# Patient Record
Sex: Male | Born: 1985 | Race: Black or African American | Hispanic: No | Marital: Single | Smoking: Current every day smoker
Health system: Southern US, Community
[De-identification: ages and names within clinical notes are randomized; demographics above are authoritative.]

---
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
## 2013-05-19 ENCOUNTER — Encounter (HOSPITAL_COMMUNITY): Payer: Self-pay | Admitting: Emergency Medicine

## 2013-05-19 ENCOUNTER — Emergency Department (HOSPITAL_COMMUNITY)
Admission: EM | Admit: 2013-05-19 | Discharge: 2013-05-19 | Disposition: A | Discharge status: Home / Self Care | Payer: Self-pay | Attending: Emergency Medicine | Admitting: Emergency Medicine

## 2013-05-19 ENCOUNTER — Emergency Department (HOSPITAL_COMMUNITY): Payer: Self-pay

## 2013-05-19 DIAGNOSIS — Y9289 Other specified places as the place of occurrence of the external cause: Secondary | ICD-10-CM | POA: Insufficient documentation

## 2013-05-19 DIAGNOSIS — S6991XA Unspecified injury of right wrist, hand and finger(s), initial encounter: Secondary | ICD-10-CM

## 2013-05-19 DIAGNOSIS — W230XXA Caught, crushed, jammed, or pinched between moving objects, initial encounter: Secondary | ICD-10-CM | POA: Insufficient documentation

## 2013-05-19 DIAGNOSIS — F17200 Nicotine dependence, unspecified, uncomplicated: Secondary | ICD-10-CM | POA: Insufficient documentation

## 2013-05-19 DIAGNOSIS — Z8781 Personal history of (healed) traumatic fracture: Secondary | ICD-10-CM | POA: Insufficient documentation

## 2013-05-19 DIAGNOSIS — S6710XA Crushing injury of unspecified finger(s), initial encounter: Secondary | ICD-10-CM | POA: Insufficient documentation

## 2013-05-19 DIAGNOSIS — Y9389 Activity, other specified: Secondary | ICD-10-CM | POA: Insufficient documentation

## 2013-05-19 NOTE — ED Notes (Signed)
Right fifth finger "shut in car door" this am. Deformity noted.

## 2013-05-19 NOTE — ED Notes (Signed)
PA in. 

## 2013-05-19 NOTE — ED Provider Notes (Signed)
CSN: [id number]     Arrival date & time 05/19/13  1191 History  This chart was scribed for non-physician practitioner Arthor Captain, PA-C working with Shelda Jakes, MD by Valera Castle, ED scribe. This patient was seen in room TR08C/TR08C and the patient's care was started at 10:07 AM.    Chief Complaint  Patient presents with  . Finger Injury    The history is provided by the patient. No language interpreter was used.   HPI Comments: Brent Elliott is a 27 y.o. male who presents to the Emergency Department complaining of sudden, moderate, constant, right finger (5th digit) pain, with moderate swelling, onset earlier this morning when he slammed his finger in a car door. He reports that he had broken the same finger before 3-4 months ago while playing basketball, and states that it had been healing well. He reports he is able to move his finger, but has trouble bending it. 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 He denies numbness to his finger, and any other associated symptoms. He denies any medical history.   History reviewed. No pertinent past medical history. History reviewed. No pertinent past surgical history. No family history on file. History  Substance Use Topics  . Smoking status: Current Every Day Smoker  . Smokeless tobacco: Not on file  . Alcohol Use: No    Review of Systems  Musculoskeletal: Positive for arthralgias (Right finger pain (5th digit)).  Neurological: Negative for numbness.  All other systems reviewed and are negative.   Allergies  Review of patient's allergies indicates no known allergies.  Home Medications  No current outpatient prescriptions on file.  Triage Vitals: BP 120/71  Pulse 67  Temp(Src) 97.7 F (36.5 C) (Oral)  Resp 16  SpO2 100%  Physical Exam  Nursing note and vitals reviewed. Constitutional: He is oriented to person, place, and time. He appears well-developed and well-nourished. No distress.   HENT:  Head: Normocephalic and atraumatic.  Eyes: EOM are normal.  Neck: Neck supple. No tracheal deviation present.  Cardiovascular: Normal rate.   Good capillary refill.  Pulmonary/Chest: Effort normal. No respiratory distress.  Musculoskeletal: Normal range of motion.  PIP resting in flexion. Significant swelling over PIP with tenderness to palpation. Able to move joint minimally.   Neurological: He is alert and oriented to person, place, and time.  Skin: Skin is warm and dry.  Psychiatric: He has a normal mood and affect. His behavior is normal.    ED Course  Procedures (including critical care time)  DIAGNOSTIC STUDIES: Oxygen Saturation is 100% on room air, normal by my interpretation.    COORDINATION OF CARE: 10:10 AM-Discussed treatment plan which includes a right finger xray with pt at bedside and pt agreed to plan. Advised pt to apply ice to the area. Will give pt a splint for his finger.   Labs Review Labs Reviewed - No data to display Imaging Review Dg Finger Little Right  05/19/2013   CLINICAL DATA:  Right 5th digit caught in the car door. Pain proximally.  EXAM: RIGHT LITTLE FINGER 2+V  COMPARISON:  None.  FINDINGS: There is posttraumatic deformity of the distal visualized aspect of the right 5th metacarpal. There there is no acute fracture or dislocation. There is severe soft tissue swelling around the right 5th PIP joint. 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 There is a well corticated ossific fragment along the volar aspect of the right 5th PIP joint likely representing sequela  of prior trauma.  IMPRESSION: 1. No acute osseous injury of the right 5th digit.  2. There is severe soft tissue swelling surrounding the right 5th PIP joint.  3. 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   Electronically Signed   By: Elige Ko   On: 05/19/2013 09:58    EKG Interpretation   None      No orders of the  defined types were placed in this encounter.     MDM  No diagnosis found. Patient X-Ray negative for obvious fracture or dislocation. Pain managed in ED. Pt advised to follow up with Hand if symptoms persist for possibility of missed fracture diagnosis. Patient given brace while in ED, conservative therapy recommended and discussed. Patient will be dc home & is agreeable with above plan.     I personally performed the services described in this documentation, which was scribed in my presence. The recorded information has been reviewed and is accurate.     Arthor Captain, PA-C 05/19/13 1022

## 2013-05-21 NOTE — ED Provider Notes (Signed)
Medical screening examination/treatment/procedure(s) were performed by non-physician practitioner and as supervising physician I was immediately available for consultation/collaboration.  EKG Interpretation   None        Amai Cappiello W. Sharma Lawrance, MD 05/21/13 0911 

## 2013-06-30 ENCOUNTER — Emergency Department (HOSPITAL_COMMUNITY)
Admission: EM | Admit: 2013-06-30 | Discharge: 2013-06-30 | Disposition: A | Discharge status: Home / Self Care | Payer: Self-pay | Attending: Emergency Medicine | Admitting: Emergency Medicine

## 2013-06-30 ENCOUNTER — Emergency Department (HOSPITAL_COMMUNITY): Payer: Self-pay

## 2013-06-30 ENCOUNTER — Encounter (HOSPITAL_COMMUNITY): Payer: Self-pay | Admitting: Emergency Medicine

## 2013-06-30 DIAGNOSIS — G930 Cerebral cysts: Secondary | ICD-10-CM | POA: Insufficient documentation

## 2013-06-30 DIAGNOSIS — R42 Dizziness and giddiness: Secondary | ICD-10-CM | POA: Insufficient documentation

## 2013-06-30 DIAGNOSIS — R51 Headache: Secondary | ICD-10-CM | POA: Insufficient documentation

## 2013-06-30 DIAGNOSIS — R1110 Vomiting, unspecified: Secondary | ICD-10-CM | POA: Insufficient documentation

## 2013-06-30 DIAGNOSIS — F17200 Nicotine dependence, unspecified, uncomplicated: Secondary | ICD-10-CM | POA: Insufficient documentation

## 2013-06-30 DIAGNOSIS — R5381 Other malaise: Secondary | ICD-10-CM | POA: Insufficient documentation

## 2013-06-30 DIAGNOSIS — H9209 Otalgia, unspecified ear: Secondary | ICD-10-CM | POA: Insufficient documentation

## 2013-06-30 DIAGNOSIS — J029 Acute pharyngitis, unspecified: Secondary | ICD-10-CM | POA: Insufficient documentation

## 2013-06-30 LAB — COMPREHENSIVE METABOLIC PANEL
AST: 20 U/L (ref 0–37)
Alkaline Phosphatase: 50 U/L (ref 39–117)
BUN: 14 mg/dL (ref 6–23)
CO2: 26 mEq/L (ref 19–32)
Chloride: 100 mEq/L (ref 96–112)
Creatinine, Ser: 1.08 mg/dL (ref 0.50–1.35)
GFR calc Af Amer: >90 mL/min (ref >90)
GFR calc non Af Amer: >90 mL/min (ref >90)
Glucose, Bld: 92 mg/dL (ref 70–99)
Total Bilirubin: 0.7 mg/dL (ref 0.3–1.2)

## 2013-06-30 LAB — CBC WITH DIFFERENTIAL/PLATELET
Eosinophils Relative: 3 % (ref 0–5)
Hemoglobin: 15.8 g/dL (ref 13.0–17.0)
Lymphocytes Relative: 27 % (ref 12–46)
MCH: 31.6 pg (ref 26.0–34.0)
MCV: 89.4 fL (ref 78.0–100.0)
Monocytes Absolute: 0.5 10*3/uL (ref 0.1–1.0)
Monocytes Relative: 9 % (ref 3–12)
Neutro Abs: 3.3 10*3/uL (ref 1.7–7.7)
Platelets: 129 10*3/uL — ABNORMAL LOW (ref 150–400)
RBC: 5 MIL/uL (ref 4.22–5.81)
RDW: 13.1 % (ref 11.5–15.5)
WBC: 5.4 10*3/uL (ref 4.0–10.5)

## 2013-06-30 MED ORDER — DIPHENHYDRAMINE HCL 50 MG/ML IJ SOLN
25.0000 mg | Freq: Once | INTRAMUSCULAR | Status: AC
  Administered 2013-06-30: 25 mg via INTRAVENOUS
  Filled 2013-06-30: qty 1

## 2013-06-30 MED ORDER — IBUPROFEN 800 MG PO TABS: 800.0000 mg | ORAL_TABLET | Freq: Four times a day (QID) | ORAL | Status: DC | PRN

## 2013-06-30 MED ORDER — ONDANSETRON 4 MG PO TBDP: 4.0000 mg | ORAL_TABLET | Freq: Three times a day (TID) | ORAL | Status: DC | PRN

## 2013-06-30 MED ORDER — SODIUM CHLORIDE 0.9 % IV BOLUS (SEPSIS)
1000.0000 mL | Freq: Once | INTRAVENOUS | Status: AC
  Administered 2013-06-30: 1000 mL via INTRAVENOUS

## 2013-06-30 MED ORDER — METOCLOPRAMIDE HCL 5 MG/ML IJ SOLN
10.0000 mg | Freq: Once | INTRAMUSCULAR | Status: AC
  Administered 2013-06-30: 10 mg via INTRAVENOUS
  Filled 2013-06-30: qty 2

## 2013-06-30 NOTE — ED Notes (Addendum)
Per EMS: pt c/o vomiting in the am x 2 months, fever x 1 month, sore throat x 2 days, and right sided head pain x 3 months. Denies diarrhea, SOB, dizziness.

## 2013-06-30 NOTE — ED Notes (Signed)
MD at bedside. 

## 2013-06-30 NOTE — ED Notes (Signed)
Patient transported to CT 
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
## 2013-06-30 NOTE — Progress Notes (Signed)
[facility] provided pt with a list of primary care resources, a [facility] orange Card application, and information on [facility].

## 2013-07-02 NOTE — ED Provider Notes (Signed)
Medical screening examination/treatment/procedure(s) were performed by non-physician practitioner and as supervising physician I was immediately available for consultation/collaboration.  EKG Interpretation   None         Junius Argyle, MD 07/02/13 1156

## 2014-01-27 ENCOUNTER — Emergency Department (HOSPITAL_COMMUNITY)
Admission: EM | Admit: 2014-01-27 | Discharge: 2014-01-27 | Disposition: A | Discharge status: Home / Self Care | Payer: Self-pay | Attending: Emergency Medicine | Admitting: Emergency Medicine

## 2014-01-27 ENCOUNTER — Encounter (HOSPITAL_COMMUNITY): Payer: Self-pay | Admitting: Emergency Medicine

## 2014-01-27 DIAGNOSIS — F17200 Nicotine dependence, unspecified, uncomplicated: Secondary | ICD-10-CM | POA: Insufficient documentation

## 2014-01-27 DIAGNOSIS — R51 Headache: Secondary | ICD-10-CM | POA: Insufficient documentation

## 2014-01-27 DIAGNOSIS — R6883 Chills (without fever): Secondary | ICD-10-CM | POA: Insufficient documentation

## 2014-01-27 DIAGNOSIS — R519 Headache, unspecified: Secondary | ICD-10-CM

## 2014-01-27 MED ORDER — METOCLOPRAMIDE HCL 10 MG PO TABS
10.0000 mg | ORAL_TABLET | Freq: Once | ORAL | Status: AC
  Administered 2014-01-27: 10 mg via ORAL
  Filled 2014-01-27: qty 1

## 2014-01-27 MED ORDER — METOCLOPRAMIDE HCL 5 MG/ML IJ SOLN
10.0000 mg | Freq: Once | INTRAMUSCULAR | Status: DC
  Filled 2014-01-27: qty 2

## 2014-01-27 MED ORDER — SODIUM CHLORIDE 0.9 % IV BOLUS (SEPSIS): 1000.0000 mL | Freq: Once | INTRAVENOUS | Status: DC

## 2014-01-27 MED ORDER — DIPHENHYDRAMINE HCL 50 MG/ML IJ SOLN
25.0000 mg | Freq: Once | INTRAMUSCULAR | Status: DC
Start: 2014-01-27 — End: 2014-01-27
  Filled 2014-01-27: qty 1

## 2014-01-27 MED ORDER — DEXAMETHASONE SODIUM PHOSPHATE 10 MG/ML IJ SOLN
10.0000 mg | Freq: Once | INTRAMUSCULAR | Status: DC
  Filled 2014-01-27: qty 1

## 2014-01-27 NOTE — ED Provider Notes (Signed)
CSN: [id number]     Arrival date & time 01/27/14  [id number] History   First MD Initiated Contact with Patient 01/27/14 [id number]     Chief Complaint  Patient presents with  . Headache     (Consider location/radiation/quality/duration/timing/severity/associated sxs/prior Treatment) Patient is a 28 y.o. male presenting with headaches. The history is provided by the patient. No language interpreter was used.  Headache Pain location:  L temporal and R temporal Quality:  Dull Radiates to:  Does not radiate Pain severity now: moderate. Pain scale at highest: severe. Onset quality:  Unable to specify Duration:  3 days Timing:  Intermittent Progression:  Waxing and waning Chronicity:  Recurrent Similar to prior headaches: yes   Relieved by:  Nothing Worsened by:  Nothing tried Ineffective treatments:  None tried Associated symptoms: no abdominal pain, no back pain, no congestion, no cough, no diarrhea, no dizziness, no ear pain, no pain, no fatigue, no fever, no focal weakness, no hearing loss, no loss of balance, no nausea, no near-syncope, no neck stiffness, no numbness, no photophobia, no seizures, no syncope, no visual change and no vomiting   Associated symptoms comment:  Chills  Risk factors comment:  Hx of incidental arachnoid cyst   History reviewed. No pertinent past medical history. History reviewed. No pertinent past surgical history. No family history on file. History  Substance Use Topics  . Smoking status: Current Some Day Smoker  . Smokeless tobacco: Not on file  . Alcohol Use: Yes     Comment: occ    Review of Systems  Constitutional: Positive for chills. Negative for fever, activity change, appetite change and fatigue.  HENT: Negative for congestion, ear pain, facial swelling, hearing loss, rhinorrhea and trouble swallowing.   Eyes: Negative for photophobia and pain.  Respiratory: Negative for cough, chest tightness and shortness of breath.   Cardiovascular: Negative for  chest pain, leg swelling, syncope and near-syncope.  Gastrointestinal: Negative for nausea, vomiting, abdominal pain, diarrhea and constipation.  Endocrine: Negative for polydipsia and polyuria.  Genitourinary: Negative for dysuria, urgency, decreased urine volume and difficulty urinating.  Musculoskeletal: Negative for back pain, gait problem and neck stiffness.  Skin: Negative for color change, rash and wound.  Allergic/Immunologic: Negative for immunocompromised state.  Neurological: Positive for headaches. Negative for dizziness, focal weakness, seizures, facial asymmetry, speech difficulty, weakness, numbness and loss of balance.  Psychiatric/Behavioral: Negative for confusion, decreased concentration and agitation.      Allergies  Review of patient's allergies indicates no known allergies.  Home Medications   Prior to Admission medications   Medication Sig Start Date End Date Taking? Authorizing Provider  ibuprofen (ADVIL,MOTRIN) 200 MG tablet Take 400 mg by mouth every 6 (six) hours as needed for moderate pain.   Yes Historical Provider, MD   BP 113/68  Pulse 70  Temp(Src) 99.7 F (37.6 C) (Oral)  Resp 18  Ht 6\' 1"  (1.854 m)  Wt 185 lb (83.915 kg)  BMI 24.41 kg/m2  SpO2 97% Physical Exam  Constitutional: He is oriented to person, place, and time. He appears well-developed and well-nourished. No distress.  HENT:  Head: Normocephalic and atraumatic.  Mouth/Throat: No oropharyngeal exudate.  Eyes: Pupils are equal, round, and reactive to light.  Neck: Normal range of motion. Neck supple.  Cardiovascular: Normal rate, regular rhythm and normal heart sounds.  Exam reveals no gallop and no friction rub.   No murmur heard. Pulmonary/Chest: Effort normal and breath sounds normal. No respiratory distress. He has no wheezes. He has  no rales.  Abdominal: Soft. Bowel sounds are normal. He exhibits no distension and no mass. There is no tenderness. There is no rebound and no  guarding.  Musculoskeletal: Normal range of motion. He exhibits no edema and no tenderness.  Neurological: He is alert and oriented to person, place, and time. He has normal strength. He displays no tremor. No cranial nerve deficit or sensory deficit. He exhibits normal muscle tone. He displays a negative Romberg sign. Coordination and gait normal. GCS eye subscore is 4. GCS verbal subscore is 5. GCS motor subscore is 6.  Skin: Skin is warm and dry.  Psychiatric: He has a normal mood and affect.    ED Course  Procedures (including critical care time) Labs Review Labs Reviewed - No data to display  Imaging Review No results found.   EKG Interpretation None      MDM   Final diagnoses:  Nonintractable episodic headache, unspecified headache type    Pt is a 28 y.o. male with Pmhx as above who presents with recurrent h/a's. Plan was to treat w/ a migraine cocktail and reassess, but pt tells nursing after I leave the room that he has to leave.  He has an appointment with a "brain doctor" this morning at 7:45 (about 3 hrs from now). I believe he has the capacity to make this decision and understands I cannot rule out a potentially life threatening cause of his h/a. He agrees to keep appt todday and to come back should symptoms worsen including worsening h/a, numbness, weakness, neck stiffness        Shanna Cisco, MD 01/27/14 0425

## 2014-01-27 NOTE — ED Notes (Signed)
Pt states that he has had a headache for 2-3 days; pt states that he had a similar headache 4-5 months ago and was diagnosed with a "cyst" inside his head; pt states that he was advised to follow up with someone but doesn't remember who and pt states that he did not follow up; pt denies N/V or blurred vision.

## 2014-01-27 NOTE — Discharge Instructions (Signed)

## 2015-01-11 ENCOUNTER — Encounter (HOSPITAL_COMMUNITY): Payer: Self-pay | Admitting: Emergency Medicine

## 2015-01-11 ENCOUNTER — Emergency Department (HOSPITAL_COMMUNITY)
Admission: EM | Admit: 2015-01-11 | Discharge: 2015-01-11 | Disposition: A | Discharge status: Home / Self Care | Payer: Self-pay | Attending: Emergency Medicine | Admitting: Emergency Medicine

## 2015-01-11 DIAGNOSIS — K029 Dental caries, unspecified: Secondary | ICD-10-CM | POA: Insufficient documentation

## 2015-01-11 DIAGNOSIS — K0381 Cracked tooth: Secondary | ICD-10-CM | POA: Insufficient documentation

## 2015-01-11 DIAGNOSIS — Z720 Tobacco use: Secondary | ICD-10-CM | POA: Insufficient documentation

## 2015-01-11 MED ORDER — PENICILLIN V POTASSIUM 500 MG PO TABS: 500.0000 mg | ORAL_TABLET | Freq: Four times a day (QID) | ORAL | Status: AC

## 2015-01-11 MED ORDER — TRAMADOL HCL 50 MG PO TABS: 50.0000 mg | ORAL_TABLET | Freq: Four times a day (QID) | ORAL | Status: DC | PRN

## 2015-01-11 MED ORDER — IBUPROFEN 600 MG PO TABS: 600.0000 mg | ORAL_TABLET | Freq: Four times a day (QID) | ORAL | Status: DC | PRN

## 2015-01-11 NOTE — ED Provider Notes (Signed)
CSN: [id number]     Arrival date & time 01/11/15  1814 History  This chart was scribed for Junius Finner, PA-C, working with Pricilla Loveless, MD by Chestine Spore, ED Scribe. The patient was seen in room WTR6/WTR6 at 6:25 PM.    Chief Complaint  Patient presents with  . Dental Pain      The history is provided by the patient. No language interpreter was used.    HPI Comments: Brent Elliott is a 29 y.o. male who presents to the Emergency Department complaining of intermittent dental pain onset 1 year. He reports that his pain is 9/10 and chewing worsens the pain. He states that he is having associated symptoms of ear pain. He states that he has tried tylenol and orajel with no relief for his symptoms. He denies fever, n/v, drainage, and any other symptoms. Pt denies having a dentist. He notes that he does smoke.   History reviewed. No pertinent past medical history. History reviewed. No pertinent past surgical history. No family history on file. History  Substance Use Topics  . Smoking status: Current Some Day Smoker  . Smokeless tobacco: Not on file  . Alcohol Use: Yes     Comment: occ    Review of Systems  Constitutional: Negative for fever.  HENT: Positive for dental problem and ear pain.   Gastrointestinal: Negative for nausea and vomiting.      Allergies  Review of patient's allergies indicates no known allergies.  Home Medications   Prior to Admission medications   Medication Sig Start Date End Date Taking? Authorizing Provider  benzocaine (ORAJEL) 10 % mucosal gel Use as directed 1 application in the mouth or throat as needed for mouth pain.   Yes Historical Provider, MD  ibuprofen (ADVIL,MOTRIN) 600 MG tablet Take 1 tablet (600 mg total) by mouth every 6 (six) hours as needed. 01/11/15   Junius Finner, PA-C  penicillin v potassium (VEETID) 500 MG tablet Take 1 tablet (500 mg total) by mouth 4 (four) times daily. 01/11/15 01/18/15  Junius Finner, PA-C  traMADol (ULTRAM)  50 MG tablet Take 1 tablet (50 mg total) by mouth every 6 (six) hours as needed. 01/11/15   Junius Finner, PA-C   BP 108/63 mmHg  Pulse 60  Temp(Src) 98.5 F (36.9 C) (Oral)  Resp 16  SpO2 99% Physical Exam  Constitutional: He is oriented to person, place, and time. He appears well-developed and well-nourished. No distress.  HENT:  Head: Normocephalic and atraumatic.  Mouth/Throat: Abnormal dentition.  All four wisdom teeth cracked, with dental decay. Left upper wisdom tooth is decayed down to the gum line  Eyes: EOM are normal.  Neck: Neck supple. No tracheal deviation present.  Cardiovascular: Normal rate.   Pulmonary/Chest: Effort normal. No respiratory distress.  Musculoskeletal: Normal range of motion.  Neurological: He is alert and oriented to person, place, and time.  Skin: Skin is warm and dry.  Psychiatric: He has a normal mood and affect. His behavior is normal.  Nursing note and vitals reviewed.   ED Course  Procedures (including critical care time) COORDINATION OF CARE: 6:27 PM-Discussed treatment plan which includes abx, pain medications, referral and f/u with dentist with pt at bedside and pt agreed to plan.   Labs Review Labs Reviewed - No data to display  Imaging Review No results found.   EKG Interpretation None      MDM   Final diagnoses:  Dental decay  Pain due to dental caries  Pt is a 29yo male c/o dental pain. Pt is a daily smoker. Dental decay on exam w/o gingival abscesses.  No respiratory distress.   Rx: PCN and tramadol. Dental resources including contact info for Dr. Russella Dar, DDS, as well as community resource guide for PCP provided. Return precautions provided. Pt verbalized understanding and agreement with tx plan.   I personally performed the services described in this documentation, which was scribed in my presence. The recorded information has been reviewed and is accurate.    Junius Finner, PA-C 01/11/15 1842  Pricilla Loveless, MD 01/13/15 1046

## 2015-01-11 NOTE — Discharge Instructions (Signed)
Dental Care and Dentist Visits °Dental care supports good overall health. Regular dental visits can also help you avoid dental pain, bleeding, infection, and other more serious health problems in the future. It is important to keep the mouth healthy because diseases in the teeth, gums, and other oral tissues can spread to other areas of the body. Some problems, such as diabetes, heart disease, and pre-term labor have been associated with poor oral health.  °See your dentist every 6 months. If you experience emergency problems such as a toothache or broken tooth, go to the dentist right away. If you see your dentist regularly, you may catch problems early. It is easier to be treated for problems in the early stages.  °WHAT TO EXPECT AT A DENTIST VISIT  °Your dentist will look for many common oral health problems and recommend proper treatment. At your regular dental visit, you can expect: °· Gentle cleaning of the teeth and gums. This includes scraping and polishing. This helps to remove the sticky substance around the teeth and gums (plaque). Plaque forms in the mouth shortly after eating. Over time, plaque hardens on the teeth as tartar. If tartar is not removed regularly, it can cause problems. Cleaning also helps remove stains. °· Periodic X-rays. These pictures of the teeth and supporting bone will help your dentist assess the health of your teeth. °· Periodic fluoride treatments. Fluoride is a natural mineral shown to help strengthen teeth. Fluoride treatment involves applying a fluoride gel or varnish to the teeth. It is most commonly done in children. °· Examination of the mouth, tongue, jaws, teeth, and gums to look for any oral health problems, such as: °· Cavities (dental caries). This is decay on the tooth caused by plaque, sugar, and acid in the mouth. It is best to catch a cavity when it is small. °· Inflammation of the gums caused by plaque buildup (gingivitis). °· Problems with the mouth or malformed  or misaligned teeth. °· Oral cancer or other diseases of the soft tissues or jaws.  °KEEP YOUR TEETH AND GUMS HEALTHY °For healthy teeth and gums, follow these general guidelines as well as your dentist's specific advice: °· Have your teeth professionally cleaned at the dentist every 6 months. °· Brush twice daily with a fluoride toothpaste. °· Floss your teeth daily.  °· Ask your dentist if you need fluoride supplements, treatments, or fluoride toothpaste. °· Eat a healthy diet. Reduce foods and drinks with added sugar. °· Avoid smoking. °TREATMENT FOR ORAL HEALTH PROBLEMS °If you have oral health problems, treatment varies depending on the conditions present in your teeth and gums. °· Your caregiver will most likely recommend good oral hygiene at each visit. °· For cavities, gingivitis, or other oral health disease, your caregiver will perform a procedure to treat the problem. This is typically done at a separate appointment. Sometimes your caregiver will refer you to another dental specialist for specific tooth problems or for surgery. °SEEK IMMEDIATE DENTAL CARE IF: °· You have pain, bleeding, or soreness in the gum, tooth, jaw, or mouth area. °· A permanent tooth becomes loose or separated from the gum socket. °· You experience a blow or injury to the mouth or jaw area. °Document Released: 03/20/2011 Document Revised: 09/30/2011 Document Reviewed: 03/20/2011 °ExitCare® Patient Information ©2015 [employer]. This information is not intended to replace advice given to you by your health care provider. Make sure you discuss any questions you have with your health care provider. ° °Dental Pain °Toothache is pain in or around   a tooth. It may get worse with chewing or with cold or heat.  HOME CARE  Your dentist may use a numbing medicine during treatment. If so, you may need to avoid eating until the medicine wears off. Ask your dentist about this.  Only take medicine as told by your dentist or  doctor.  Avoid chewing food near the painful tooth until after all treatment is done. Ask your dentist about this. GET HELP RIGHT AWAY IF:   The problem gets worse or new problems appear.  You have a fever.  There is redness and puffiness (swelling) of the face, jaw, or neck.  You cannot open your mouth.  There is pain in the jaw.  There is very bad pain that is not helped by medicine. MAKE SURE YOU:   Understand these instructions.  Will watch your condition.  Will get help right away if you are not doing well or get worse. Document Released: 12/25/2007 Document Revised: 09/30/2011 Document Reviewed: 12/25/2007 [facility] Patient Information 2015 [place], [place]. This information is not intended to replace advice given to you by your health care provider. Make sure you discuss any questions you have with your health care provider.   Emergency Department Resource Guide 1) Find a Doctor and Pay Out of Pocket Although you won't have to find out who is covered by your insurance plan, it is a good idea to ask around and get recommendations. You will then need to call the office and see if the doctor you have chosen will accept you as a new patient and what types of options they offer for patients who are self-pay. Some doctors offer discounts or will set up payment plans for their patients who do not have insurance, but you will need to ask so you aren't surprised when you get to your appointment.  2) Contact Your Local Health Department Not all health departments have doctors that can see patients for sick visits, but many do, so it is worth a call to see if yours does. If you don't know where your local health department is, you can check in your phone book. The CDC also has a tool to help you locate your state's health department, and many state websites also have listings of all of their local health departments.  3) Find a Walk-in Clinic If your illness is not likely to be very  severe or complicated, you may want to try a walk in clinic. These are popping up all over the country in pharmacies, drugstores, and shopping centers. They're usually staffed by nurse practitioners or physician assistants that have been trained to treat common illnesses and complaints. They're usually fairly quick and inexpensive. However, if you have serious medical issues or chronic medical problems, these are probably not your best option.  No Primary Care Doctor: - Call Health Connect at  [phone] - they can help you locate a primary care doctor that  accepts your insurance, provides certain services, etc. - Physician Referral Service- [phone]  Chronic Pain Problems: Organization         Address  Phone   Notes  [facility]  [phone] Patients need to be referred by their primary care doctor.   Medication Assistance: Organization         Address  Phone   Notes  [facility] Medication [facility] [address] [place], [place] [address] [phone] --Must be a resident of [facility] -- Must have NO insurance  coverage whatsoever (no Medicaid/ Medicare, etc.) -- The pt. MUST have a primary care doctor that directs their care regularly and follows them in the community   [facility]ist  [phone]   [employer]  [phone]    Agencies that provide inexpensive medical care: Organization         Address  Phone   Notes  [facility]  [phone]   [facility]    [phone]   [facility] [address] [place], [place] [address] [phone]   [facility] [address] [place], [place] [phone]   Planned Parenthood    [phone]   [facility]    [phone]   [facility]  [address], [place] Phone:  [phone], Fax:  [phone] Hours of Operation:  9 am - 6 pm, M-F.  Also accepts  Medicaid/Medicare and self-pay.  [facility]  [address], [place] Phone: [phone], Fax: [phone]. Hours of Operation:  8:30 am - 5:30 pm, M-F.  Also accepts Medicaid and self-pay.  [facility] [place] [address] [place] Phone: [phone]   [facility] [address] Natasha Bence [place], [place] [phone], Ext. [phone] Mondays & Thursdays: 7-9 AM.  First 15 patients are seen on a first come, first serve basis.    Medicaid-accepting [facility] Providers:  Organization         Address  Phone   Notes  [facility] [address], [place] [phone] Also accepts self-pay patients.  [facility] [address] Laurell Josephs [place], [place]  [phone]   [facility] [address], [place] [phone]   [facility] [address], [place] [phone]   Renaye Rakers [address], [place]   [phone] Only accepts [place] patients after they have their name applied to their card.   Self-Pay (no insurance) in [facility]:  Organization         Address  Phone   Notes  Sickle Cell Patients, [facility] [address] [place], [place] [phone]   [facility] [address] [place], [place] [phone]   [facility]  [address], [place] [phone]   [facility]/Dr. Osei-Bonsu  [address] [place] or [address], [place] [phone] Phone number for both [place] and [place] locations is the same.  [facility] [address], [place] [phone]   [facility] [address], [place] or [address] [phone]   [facility] [address], [place] [phone], phone; [phone], fax Sees patients 1st and 3rd Saturday of every month.  Must not qualify for public or private insurance (i.e. Medicaid, Medicare, [place] Health Choice, Veterans' Benefits)  Household income should be no more than 200% of the poverty level The clinic cannot treat you if you are pregnant or think you are pregnant  Sexually transmitted diseases are not treated at the clinic.    Dental Care: Organization         Address  Phone  Notes  [facility] Department  of Public Health [facility] [address] [place], [place] [phone] Accepts children up to age 70 who are enrolled in [place] or [place] Health Choice; pregnant women with a Medicaid card; and children who have applied for Medicaid or [place] Health Choice, but were declined, whose parents can pay a reduced fee at time of service.  [facility] Department of [facility]  [address], [place] [phone] Accepts children up to age 32 who are enrolled in [place] or [place] Health Choice; pregnant women with a Medicaid card; and children who have applied for Medicaid or [place] Health Choice, but were declined, whose parents can pay a reduced fee at time of service.  [facility]  [address] [place], [place] [phone] Patients are seen by appointment only. Walk-ins are not accepted. [facility] will see patients 88 years of age and older. Monday - Tuesday (8am-5pm) Most Wednesdays (8:30-5pm) $30 per visit, cash only  [facility] Adult Dental Access PROGRAM  [address], [facility] [phone] Patients are seen by appointment only. Walk-ins are not accepted. [facility] will see patients 50 years of age and older. One Wednesday Evening (Monthly: Volunteer Based).  $30 per visit, cash only  [employer]  [phone] for adults; Children under age 28, call Graduate Pediatric Dentistry at [phone]. Children aged  80-14, please call [phone] to request a pediatric application.  Dental services are provided in all areas of dental care including fillings, crowns and bridges, complete and partial dentures, implants, gum treatment, root canals, and extractions. Preventive care is also provided. Treatment is provided to both adults and children. Patients are selected via a lottery and there is often a waiting list.   [facility] [address], [place]  [phone] [email or web address]   [facility] [address] [place], [place] [phone], Ext. [phone] Second and Fourth Thursday of each month, opens at 6:30 AM; Clinic ends at 9 AM.  Patients are seen on a first-come first-served basis, and a limited number are seen during each clinic.   [facility]  [address] Ether Griffins [place], [place] [phone]   Eligibility Requirements You must have lived in [place], [place], or [place] counties for at least the last three months.   You cannot be eligible for state or federal sponsored [place], including [employer], [place], or [employer].   You generally cannot be eligible for healthcare insurance through your employer.    How to apply: Eligibility screenings are held every Tuesday and Wednesday afternoon from 1:00 pm until 4:00 pm. You do not need an appointment for the interview!  [facility] [address], [place], [place] [phone]   [facility] Health Department  [phone]   [facility]  [phone]   [facility]  [phone]    Behavioral Health Resources in the Community: Intensive Outpatient Programs Organization         Address  Phone  Notes  [facility] [address] [place], [place] [phone]   [facility] [address], [place], [place] [phone]   [facility] [address],  [place], [place]  [phone]   [facility] [address]  [place], [place] [phone] or [phone]   Substance Abuse Resources Organization  Address  Phone  Notes  [facility]  [phone]   [place]  [phone]   [facility] [place]  [phone]   Chinita Pester  [phone]   Residential & Outpatient Substance Abuse Program  [phone]   Psychological Services Organization         Address  Phone  Notes  [facility] [place]  [place]  [phone]   [place] [address], [place] or [phone]    Mobile Crisis Teams Organization         Address  Phone  Notes  [facility], Mobile Crisis Care Unit  [phone]   [facility]  [address] [place], [place]   [place] [address], [place] [phone]    Self-Help/Support Groups Organization         Address  Phone             Notes  [place]. of [place] - variety of support groups  [place] Call for more information  Narcotics Anonymous (NA), [facility] [address], [employer] [place]  2 meetings at this location   Special educational needs teacher         Address  Phone  Notes  [facility] [place],    [place]  [phone]   [facility]  [address] [place] [address], [place], [place]   [facility] [place], [place] [phone] Admissions: 8am-3pm M-F  [facility] [address]    [place], [place] [phone]   [facility] [address] [place], [place], [place]   [facility] [address]  [place], [place]   [facility] - Intensive Outpatient [address] Kristeen Mans [address], [place], [place]   [facility]  ([place] [place].,  [place], [place] [phone] or [phone]   [facility] ([facility]) [address] [place], [place] Accepts Medicaid  [facility] [address]  [place] [phone] Substance Abuse/Addiction Treatment   [facility] Organization         Address  Phone  Notes  [facility]  [phone]   Domenic Schwab, PhD [address] Arlis Porta [place], [place]   [phone] or [phone]   [place], [place] [phone]   [facility] [address], [place], [place] [phone] Insurance/Medicaid/sponsorship through [facility] [address] [place]                                    [place], [place] [phone] [place] [address], [place] [phone]    Dr. Adele Schilder  [phone]   [facility] Dept. 1) [address] [place] 2) [place] 3)  [place] [address], [place] [phone]  [phone]   [employer] [phone] or [phone] (After Hours)

## 2015-01-11 NOTE — ED Notes (Signed)
Pt reports wisdom teeth pain for 1.5 years unrelieved by Orajel recently.

## 2015-02-02 ENCOUNTER — Encounter (HOSPITAL_COMMUNITY): Payer: Self-pay | Admitting: Vascular Surgery

## 2015-02-02 ENCOUNTER — Emergency Department (HOSPITAL_COMMUNITY)
Admission: EM | Admit: 2015-02-02 | Discharge: 2015-02-02 | Disposition: A | Discharge status: Home / Self Care | Payer: Self-pay | Attending: Emergency Medicine | Admitting: Emergency Medicine

## 2015-02-02 DIAGNOSIS — W500XXA Accidental hit or strike by another person, initial encounter: Secondary | ICD-10-CM | POA: Insufficient documentation

## 2015-02-02 DIAGNOSIS — S01111A Laceration without foreign body of right eyelid and periocular area, initial encounter: Secondary | ICD-10-CM | POA: Insufficient documentation

## 2015-02-02 DIAGNOSIS — Z720 Tobacco use: Secondary | ICD-10-CM | POA: Insufficient documentation

## 2015-02-02 DIAGNOSIS — Y9367 Activity, basketball: Secondary | ICD-10-CM | POA: Insufficient documentation

## 2015-02-02 DIAGNOSIS — Y998 Other external cause status: Secondary | ICD-10-CM | POA: Insufficient documentation

## 2015-02-02 DIAGNOSIS — Y92310 Basketball court as the place of occurrence of the external cause: Secondary | ICD-10-CM | POA: Insufficient documentation

## 2015-02-02 NOTE — ED Notes (Signed)
Pt reports to the ED for eval of approx 1.5 in laceration to right eyebrow. Reports he was playing basketball when the injury occurred. Denies any LOC. Bleeding controlled. Pt A&Ox4, resp e/u, and skin warm and dry.

## 2015-02-02 NOTE — ED Notes (Signed)
Pt left prior to receiving dc instructioins

## 2015-02-02 NOTE — Discharge Instructions (Signed)
Wait 12 hours before taking a shower so the glue has time to dry. The glue will start to come off by itself after a few days, do not pick at the glue. You may wash her face as normal with mild soap. Return to ED if you begin to experience changes in vision, increased redness or swelling around your cut site, fevers or chills.  Facial Laceration  A facial laceration is a cut on the face. These injuries can be painful and cause bleeding. Lacerations usually heal quickly, but they need special care to reduce scarring. DIAGNOSIS  Your health care provider will take a medical history, ask for details about how the injury occurred, and examine the wound to determine how deep the cut is. TREATMENT  Some facial lacerations may not require closure. Others may not be able to be closed because of an increased risk of infection. The risk of infection and the chance for successful closure will depend on various factors, including the amount of time since the injury occurred. The wound may be cleaned to help prevent infection. If closure is appropriate, pain medicines may be given if needed. Your health care provider will use stitches (sutures), wound glue (adhesive), or skin adhesive strips to repair the laceration. These tools bring the skin edges together to allow for faster healing and a better cosmetic outcome. If needed, you may also be given a tetanus shot. HOME CARE INSTRUCTIONS  Only take over-the-counter or prescription medicines as directed by your health care provider.  Follow your health care provider's instructions for wound care. These instructions will vary depending on the technique used for closing the wound. For Sutures:  Keep the wound clean and dry.   If you were given a bandage (dressing), you should change it at least once a day. Also change the dressing if it becomes wet or dirty, or as directed by your health care provider.   Wash the wound with soap and water 2 times a day. Rinse  the wound off with water to remove all soap. Pat the wound dry with a clean towel.   After cleaning, apply a thin layer of the antibiotic ointment recommended by your health care provider. This will help prevent infection and keep the dressing from sticking.   You may shower as usual after the first 24 hours. Do not soak the wound in water until the sutures are removed.   Get your sutures removed as directed by your health care provider. With facial lacerations, sutures should usually be taken out after 4-5 days to avoid stitch marks.   Wait a few days after your sutures are removed before applying any makeup. For Skin Adhesive Strips:  Keep the wound clean and dry.   Do not get the skin adhesive strips wet. You may bathe carefully, using caution to keep the wound dry.   If the wound gets wet, pat it dry with a clean towel.   Skin adhesive strips will fall off on their own. You may trim the strips as the wound heals. Do not remove skin adhesive strips that are still stuck to the wound. They will fall off in time.  For Wound Adhesive:  You may briefly wet your wound in the shower or bath. Do not soak or scrub the wound. Do not swim. Avoid periods of heavy sweating until the skin adhesive has fallen off on its own. After showering or bathing, gently pat the wound dry with a clean towel.   Do not apply  liquid medicine, cream medicine, ointment medicine, or makeup to your wound while the skin adhesive is in place. This may loosen the film before your wound is healed.   If a dressing is placed over the wound, be careful not to apply tape directly over the skin adhesive. This may cause the adhesive to be pulled off before the wound is healed.   Avoid prolonged exposure to sunlight or tanning lamps while the skin adhesive is in place.  The skin adhesive will usually remain in place for 5-10 days, then naturally fall off the skin. Do not pick at the adhesive film.  After  Healing: Once the wound has healed, cover the wound with sunscreen during the day for 1 full year. This can help minimize scarring. Exposure to ultraviolet light in the first year will darken the scar. It can take 1-2 years for the scar to lose its redness and to heal completely.  SEEK IMMEDIATE MEDICAL CARE IF:  You have redness, pain, or swelling around the wound.   You see ayellowish-white fluid (pus) coming from the wound.   You have chills or a fever.  MAKE SURE YOU:  Understand these instructions.  Will watch your condition.  Will get help right away if you are not doing well or get worse. Document Released: 08/15/2004 Document Revised: 04/28/2013 Document Reviewed: 02/18/2013 [facility] Patient Information 2015 [place], [place]. This information is not intended to replace advice given to you by your health care provider. Make sure you discuss any questions you have with your health care provider.

## 2015-02-02 NOTE — ED Notes (Signed)
Pt playing basketball at [age] when he was elbowed in his right eyebrow sustaining a 1.54 cm laceration.  No LOC.  Non bleeding at present.  C/Osoreness to site but no pain.  Alert, orineted, cooperative.  DT one year ago

## 2015-02-02 NOTE — ED Provider Notes (Signed)
CSN: [id number]     Arrival date & time 02/02/15  2026 History  This chart was scribed for Joycie Peek, PA-C, working with Mirian Mo, MD by Chestine Spore, ED Scribe. The patient was seen in room TR08C/TR08C at 9:00 PM.    Chief Complaint  Patient presents with  . Head Laceration      The history is provided by the patient. No language interpreter was used.    HPI Comments: Brent Elliott is a 29 y.o. male who presents to the Emergency Department complaining of head laceration onset tonight at 7:30 PM. Pt reports that he was playing basketball when an elbow hit him in his right eye. He denies LOC, n/v, dizziness, and any other symptoms. Rates discomfort as a 1/10. Pt denies bleeding disorders and any other medical problems. Patient is at baseline per family in the room.   History reviewed. No pertinent past medical history. History reviewed. No pertinent past surgical history. No family history on file. History  Substance Use Topics  . Smoking status: Current Some Day Smoker  . Smokeless tobacco: Not on file  . Alcohol Use: Yes     Comment: occ    Review of Systems  Gastrointestinal: Negative for nausea and vomiting.  Skin: Positive for wound. Negative for color change.  Neurological: Negative for dizziness and syncope.  All other systems reviewed and are negative.     Allergies  Review of patient's allergies indicates no known allergies.  Home Medications   Prior to Admission medications   Medication Sig Start Date End Date Taking? Authorizing Provider  benzocaine (ORAJEL) 10 % mucosal gel Use as directed 1 application in the mouth or throat as needed for mouth pain.    Historical Provider, MD  ibuprofen (ADVIL,MOTRIN) 600 MG tablet Take 1 tablet (600 mg total) by mouth every 6 (six) hours as needed. 01/11/15   Junius Finner, PA-C  traMADol (ULTRAM) 50 MG tablet Take 1 tablet (50 mg total) by mouth every 6 (six) hours as needed. 01/11/15   Junius Finner, PA-C    BP 111/67 mmHg  Pulse 62  Temp(Src) 98.5 F (36.9 C) (Oral)  Resp 16  Ht 6' (1.829 m)  Wt 170 lb (77.111 kg)  BMI 23.05 kg/m2  SpO2 98% Physical Exam  Constitutional: He is oriented to person, place, and time. He appears well-developed and well-nourished. No distress.  HENT:  Head: Normocephalic and atraumatic.  Eyes: EOM are normal. Pupils are equal, round, and reactive to light.  EOM intact without discomfort.  No periorbital tenderness.   Neck: Neck supple. No tracheal deviation present.  Cardiovascular: Normal rate, regular rhythm and normal heart sounds.  Exam reveals no gallop and no friction rub.   No murmur heard. Pulmonary/Chest: Effort normal and breath sounds normal. No respiratory distress. He has no wheezes. He has no rales.  Musculoskeletal: Normal range of motion.  Neurological: He is alert and oriented to person, place, and time.  Skin: Skin is warm and dry. Laceration noted.  1.5 cm laceration over right eyebrow.  Psychiatric: He has a normal mood and affect. His behavior is normal.  Nursing note and vitals reviewed.   ED Course  Procedures (including critical care time) DIAGNOSTIC STUDIES: Oxygen Saturation is 98% on RA, nl by my interpretation.    COORDINATION OF CARE: 9:04 PM-Discussed treatment plan with pt at bedside and pt agreed to plan.   Labs Review Labs Reviewed - No data to display  Imaging Review No results found.  EKG Interpretation None     LACERATION REPAIR Performed by: Sharlene Mottsartner, Daryan Buell W Authorized by: Sharlene Mottsartner, Shawanda Sievert W Consent: Verbal consent obtained. Risks and benefits: risks, benefits and alternatives were discussed Consent given by: patient Patient identity confirmed: provided demographic data Prepped and Draped in normal sterile fashion Wound explored  Laceration Location: Right eyebrow  Laceration Length: 1.5 cm  No Foreign Bodies seen or palpated  Anesthesia: local infiltration  Local anesthetic:  lidocaine 0 % none epinephrine  Anesthetic total: 0 ml  Irrigation method: syringe Amount of cleaning: standard  Skin closure: Dermabond   Number of sutures: Dermabond   Technique: Dermabond   Patient tolerance: Patient tolerated the procedure well with no immediate complications. MDM  Vitals stable - WNL -afebrile Pt resting comfortably in ED. PE--physical exam not concerning. Small laceration over right eyebrow. No evidence of orbital entrapment or other periorbital fracture. Laceration repaired at bedside by myself with Dermabond.  Discussed follow-up with primary care for further violation management of symptoms. No evidence of other acute emergent pathology. I discussed all relevant lab findings and imaging results with pt and they verbalized understanding. Discussed f/u with PCP within 48 hrs and return precautions, pt very amenable to plan.  Final diagnoses:  Eyebrow laceration, right, initial encounter    I personally performed the services described in this documentation, which was scribed in my presence. The recorded information has been reviewed and is accurate.   Joycie PeekBenjamin Lenox Bink, PA-C 02/02/15 2131  Mirian MoMatthew Gentry, MD 02/03/15 [phone]

## 2015-03-26 ENCOUNTER — Emergency Department (HOSPITAL_COMMUNITY): Payer: Self-pay

## 2015-03-26 ENCOUNTER — Encounter (HOSPITAL_COMMUNITY): Payer: Self-pay | Admitting: *Deleted

## 2015-03-26 ENCOUNTER — Emergency Department (HOSPITAL_COMMUNITY)
Admission: EM | Admit: 2015-03-26 | Discharge: 2015-03-26 | Discharge status: Against Medical Advice | Payer: Self-pay | Attending: Emergency Medicine | Admitting: Emergency Medicine

## 2015-03-26 DIAGNOSIS — W230XXA Caught, crushed, jammed, or pinched between moving objects, initial encounter: Secondary | ICD-10-CM | POA: Insufficient documentation

## 2015-03-26 DIAGNOSIS — S63289A Dislocation of proximal interphalangeal joint of unspecified finger, initial encounter: Secondary | ICD-10-CM

## 2015-03-26 DIAGNOSIS — W19XXXA Unspecified fall, initial encounter: Secondary | ICD-10-CM

## 2015-03-26 DIAGNOSIS — Y92310 Basketball court as the place of occurrence of the external cause: Secondary | ICD-10-CM | POA: Insufficient documentation

## 2015-03-26 DIAGNOSIS — S63283A Dislocation of proximal interphalangeal joint of left middle finger, initial encounter: Secondary | ICD-10-CM | POA: Insufficient documentation

## 2015-03-26 DIAGNOSIS — S60413A Abrasion of left middle finger, initial encounter: Secondary | ICD-10-CM | POA: Insufficient documentation

## 2015-03-26 DIAGNOSIS — Y999 Unspecified external cause status: Secondary | ICD-10-CM | POA: Insufficient documentation

## 2015-03-26 DIAGNOSIS — Y9367 Activity, basketball: Secondary | ICD-10-CM | POA: Insufficient documentation

## 2015-03-26 DIAGNOSIS — Z720 Tobacco use: Secondary | ICD-10-CM | POA: Insufficient documentation

## 2015-03-26 MED ORDER — BUPIVACAINE HCL (PF) 0.5 % IJ SOLN
10.0000 mL | Freq: Once | INTRAMUSCULAR | Status: AC
  Administered 2015-03-26: 10 mL
  Filled 2015-03-26: qty 10

## 2015-03-26 MED ORDER — MORPHINE SULFATE (PF) 4 MG/ML IV SOLN: 4.0000 mg | Freq: Once | INTRAVENOUS | Status: DC

## 2015-03-26 MED ORDER — MORPHINE SULFATE (PF) 4 MG/ML IV SOLN
4.0000 mg | Freq: Once | INTRAVENOUS | Status: DC
  Filled 2015-03-26: qty 1

## 2015-03-26 NOTE — ED Notes (Signed)
MD at the bedside  

## 2015-03-26 NOTE — ED Notes (Signed)
Pt ask about if need to wait for second xray of his hand, pt oriented about the need of verify if bones got on place before splint, xray transportation got to his room at 2000 and pt was gone against medical advice.

## 2015-03-26 NOTE — ED Provider Notes (Signed)
History  This chart was scribed for non-physician practitioner, Wynetta Emery, PA-C,working with Glynn Octave, MD, by Karle Plumber, ED Scribe. This patient was seen in room TR11C/TR11C and the patient's care was started at [facility] PM.  Chief Complaint  Patient presents with  . Hand Injury   The history is provided by the patient and medical records. No language interpreter was used.    HPI Comments:  Brent Elliott is a 29 y.o. male who presents to the Emergency Department complaining of severe pain to the third digit of the left hand that he sustained while playing basketball approximately two hours ago. Pt reports associated abrasions to the finger as well. He states the finger was jammed and bent backwards. He has not taken anything for pain. Touching the area makes the pain worse. He denies alleviating factors. He denies numbness, tingling or weakness of the left third digit or left hand, nausea or vomiting. He states his tetanus vaccination was updated in the past few months. He is right hand dominant.  History reviewed. No pertinent past medical history. History reviewed. No pertinent past surgical history. No family history on file. Social History  Substance Use Topics  . Smoking status: Current Some Day Smoker  . Smokeless tobacco: None  . Alcohol Use: Yes     Comment: occ    Review of Systems A complete 10 system review of systems was obtained and all systems are negative except as noted in the HPI and PMH.   Allergies  Review of patient's allergies indicates no known allergies.  Home Medications   Prior to Admission medications   Medication Sig Start Date End Date Taking? Authorizing Provider  benzocaine (ORAJEL) 10 % mucosal gel Use as directed 1 application in the mouth or throat as needed for mouth pain.    Historical Provider, MD  ibuprofen (ADVIL,MOTRIN) 600 MG tablet Take 1 tablet (600 mg total) by mouth every 6 (six) hours as needed. 01/11/15   Junius Finner, PA-C  traMADol (ULTRAM) 50 MG tablet Take 1 tablet (50 mg total) by mouth every 6 (six) hours as needed. 01/11/15   Junius Finner, PA-C   Triage Vitals: BP 118/71 mmHg  Pulse 50  Temp(Src) 98.8 F (37.1 C) (Oral)  Resp 16  Ht 6' (1.829 m)  Wt 170 lb (77.111 kg)  BMI 23.05 kg/m2  SpO2 99% Physical Exam  Constitutional: He is oriented to person, place, and time. He appears well-developed and well-nourished.  HENT:  Head: Normocephalic and atraumatic.  Eyes: EOM are normal.  Neck: Normal range of motion.  Cardiovascular: Normal rate.   Pulmonary/Chest: Effort normal.  Musculoskeletal: Normal range of motion. He exhibits tenderness.  Partial thickness abrasion to volar side of PIP of left 3rd digit. Appears to be a dislocation on PIP with no ROM to that joint. Exquisitely tender to palpation.  Neurological: He is alert and oriented to person, place, and time.  Neurovascularly intact.  Skin: Skin is warm and dry.  Psychiatric: He has a normal mood and affect. His behavior is normal.  Nursing note and vitals reviewed.   ED Course  Reduction of dislocation Date/Time: 03/27/2015 2:16 AM Performed by: Wynetta Emery Authorized by: Wynetta Emery Consent: Verbal consent obtained. Consent given by: patient Patient identity confirmed: verbally with patient Local anesthesia used: yes Anesthesia: digital block Local anesthetic: bupivacaine 0.5% with epinephrine Anesthetic total: 5 ml Patient sedated: no Patient tolerance: Patient tolerated the procedure well with no immediate complications Comments: Manual closed reduction performed  with anatomic alignment achieved and improved range of motion.   (including critical care time) DIAGNOSTIC STUDIES: Oxygen Saturation is 99% on RA, normal by my interpretation.   COORDINATION OF CARE: 6:13 PM- Will order pain medication and X-Ray left third digit. Will give digital block to left 3rd digit. Pt verbalizes understanding and  agrees to plan.  7:40 PM- Pt came out of room and asked if he could have the splint and forego the second X-Ray. It was explained to pt that he needed the second X-Ray to make sure the finger was properly aligned.   7:55 PM- X-Ray came to get patient to have second X-Ray and he was no longer in room or anywhere in triage.   Medications  bupivacaine (MARCAINE) 0.5 % injection 10 mL (10 mLs Infiltration Given 03/26/15 1906)    Labs Review Labs Reviewed - No data to display  Imaging Review Dg Finger Middle Left  03/26/2015   CLINICAL DATA:  Basketball injury with pain in the proximal interphalangeal joint.  EXAM: LEFT MIDDLE FINGER 2+V  COMPARISON:  None.  FINDINGS: The middle phalanx of the middle finger is posteriorly dislocated with respect to the proximal phalanx, with 2-3 mm of overlap. No well-defined fracture.  IMPRESSION: 1. Dislocated proximal interphalangeal joint, with the middle phalanx dislocated posteriorly with respect to the proximal phalanx, and with 2-3 mm of overlap. No well-defined fracture observed.   Electronically Signed   By: Gaylyn Rong M.D.   On: 03/26/2015 19:05   I have personally reviewed and evaluated these images and lab results as part of my medical decision-making.   EKG Interpretation None      MDM   Final diagnoses:  Dislocation of finger PIP joint, initial encounter    Filed Vitals:   03/26/15 1743  BP: 118/71  Pulse: 50  Temp: 98.8 F (37.1 C)  TempSrc: Oral  Resp: 16  Height: 6' (1.829 m)  Weight: 170 lb (77.111 kg)  SpO2: 99%    Medications  bupivacaine (MARCAINE) 0.5 % injection 10 mL (10 mLs Infiltration Given 03/26/15 1906)    JAIREN Elliott is a pleasant 29 y.o. male presenting with deformity to left middle finger PIP, patient also has a very shallow partial thickness abrasion. X-ray does not show any fracture. Joint is reduced.  Patient pending postreduction film, patient queried if this was necessary and I explained to him  that this was indicated to ensure good alignment of the joint. When I went back into the room, I find that the patient has eloped. I was not able to give him a splint before he left or any referral for outpatient care.  I personally performed the services described in this documentation, which was scribed in my presence. The recorded information has been reviewed and is accurate.    Wynetta Emery, PA-C 03/27/15 9147  Glynn Octave, MD 03/27/15 [phone]

## 2015-03-26 NOTE — ED Notes (Signed)
Pt arrives to ED c/o left hand injury. States that he was playing basketball and landed on his left hand. Deformity noted to left hand middle finger. Pt able to move all other fingers.

## 2015-06-28 ENCOUNTER — Encounter (HOSPITAL_COMMUNITY): Payer: Self-pay | Admitting: Emergency Medicine

## 2015-06-28 ENCOUNTER — Emergency Department (HOSPITAL_COMMUNITY)
Admission: EM | Admit: 2015-06-28 | Discharge: 2015-06-28 | Disposition: A | Discharge status: Home / Self Care | Payer: Self-pay | Attending: Emergency Medicine | Admitting: Emergency Medicine

## 2015-06-28 DIAGNOSIS — F17200 Nicotine dependence, unspecified, uncomplicated: Secondary | ICD-10-CM | POA: Insufficient documentation

## 2015-06-28 DIAGNOSIS — K029 Dental caries, unspecified: Secondary | ICD-10-CM | POA: Insufficient documentation

## 2015-06-28 DIAGNOSIS — K0889 Other specified disorders of teeth and supporting structures: Secondary | ICD-10-CM | POA: Insufficient documentation

## 2015-06-28 DIAGNOSIS — H9209 Otalgia, unspecified ear: Secondary | ICD-10-CM | POA: Insufficient documentation

## 2015-06-28 MED ORDER — PENICILLIN V POTASSIUM 250 MG PO TABS: 250.0000 mg | ORAL_TABLET | Freq: Four times a day (QID) | ORAL | Status: AC

## 2015-06-28 NOTE — ED Notes (Signed)
Per EMS pt comes in for dental pain x 3 days.  No meds

## 2015-06-28 NOTE — ED Provider Notes (Signed)
CSN: [id number]     Arrival date & time 06/28/15  1357 History  By signing my name below, I, Freida Busmaniana Omoyeni, attest that this documentation has been prepared under the direction and in the presence of non-physician practitioner, Teressa LowerVrinda Ural Acree, NP. Electronically Signed: Freida Busmaniana Omoyeni, Scribe. 06/28/2015. 3:03 PM.    Chief Complaint  Patient presents with  . Dental Pain     The history is provided by the patient. No language interpreter was used.     HPI Comments:  Brent Elliott is a 29 y.o. male who presents to the Emergency Department complaining of 10/10 left sided dental pain for 3 days. He reports associated ear pain. Pt notes he has holes in several of his wisdom teeth/molars. No alleviating factors noted. No fever or problems swallowing or breathing.  History reviewed. No pertinent past medical history. History reviewed. No pertinent past surgical history. No family history on file. Social History  Substance Use Topics  . Smoking status: Current Some Day Smoker  . Smokeless tobacco: None  . Alcohol Use: Yes     Comment: occ    Review of Systems  Constitutional: Negative for fever and chills.  HENT: Positive for dental problem and ear pain.   Respiratory: Negative for shortness of breath.   Cardiovascular: Negative for chest pain.  All other systems reviewed and are negative.   Allergies  Review of patient's allergies indicates no known allergies.  Home Medications   Prior to Admission medications   Medication Sig Start Date End Date Taking? Authorizing Provider  benzocaine (ORAJEL) 10 % mucosal gel Use as directed 1 application in the mouth or throat as needed for mouth pain.   Yes Historical Provider, MD  ibuprofen (ADVIL,MOTRIN) 600 MG tablet Take 1 tablet (600 mg total) by mouth every 6 (six) hours as needed. Patient not taking: Reported on 06/28/2015 01/11/15   Junius FinnerErin O'Malley, PA-C  traMADol (ULTRAM) 50 MG tablet Take 1 tablet (50 mg total) by mouth every 6  (six) hours as needed. Patient not taking: Reported on 06/28/2015 01/11/15   Junius FinnerErin O'Malley, PA-C   BP 146/93 mmHg  Pulse 43  Temp(Src) 97.8 F (36.6 C) (Oral)  Resp 14  SpO2 100% Physical Exam  Constitutional: He is oriented to person, place, and time. He appears well-developed and well-nourished. No distress.  HENT:  Head: Normocephalic and atraumatic.  Right Ear: External ear normal.  Left Ear: External ear normal.  Multiple decayed teeth to upper dentition. No gum or facail swelling noted  Eyes: Conjunctivae are normal.  Cardiovascular: Normal rate.   Pulmonary/Chest: Effort normal.  Abdominal: He exhibits no distension.  Neurological: He is alert and oriented to person, place, and time.  Skin: Skin is warm and dry.  Psychiatric: He has a normal mood and affect.  Nursing note and vitals reviewed.   ED Course  Procedures   DIAGNOSTIC STUDIES:  Oxygen Saturation is 100% on RA, normal by my interpretation.    COORDINATION OF CARE:  3:03 PM Discussed treatment plan with pt at bedside and pt agreed to plan.    MDM   Final diagnoses:  Toothache    Given pcn. No sign of ludwigs angina.  I personally performed the services described in this documentation, which was scribed in my presence. The recorded information has been reviewed and is accurate.    Teressa LowerVrinda Sumit Branham, NP 06/28/15 1516  Melene Planan Floyd, DO 06/28/15 2326

## 2015-07-14 IMAGING — CT CT HEAD W/O CM
2 series · 17 of 30 positions shown, 20 images · non-contrast
Comparison: None

CLINICAL DATA: Vomiting, fever

EXAM:
CT HEAD WITHOUT CONTRAST
TECHNIQUE: Contiguous axial images were obtained from the base of the skull
through the vertex without contrast.

[Series 2: head w/o · axial · non-contrast · 0.48mm/px · z∈[-141,-16]mm · 9 of 33 slices shown, 12 images]
[im 4/33  brain]
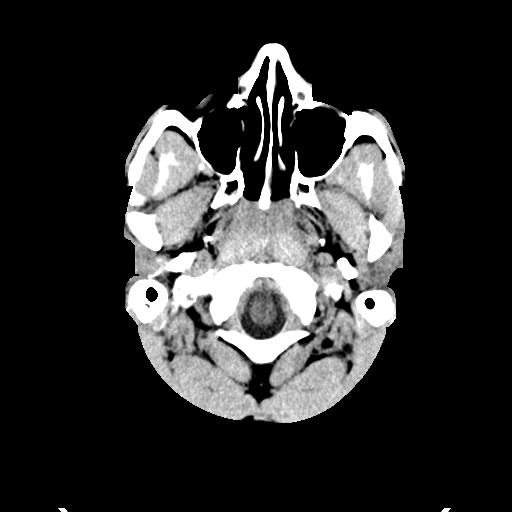
[im 4/33  bone]
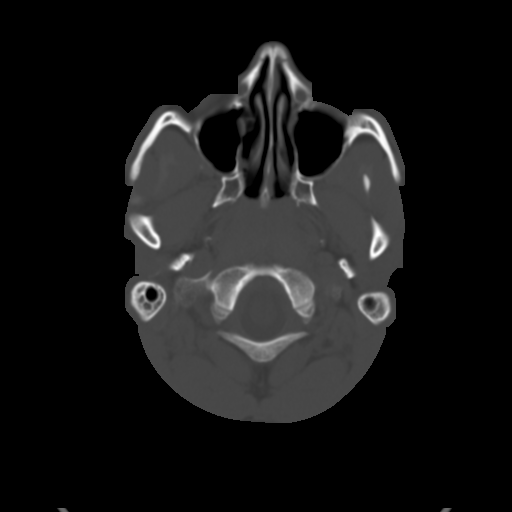
[im 7/33  brain]
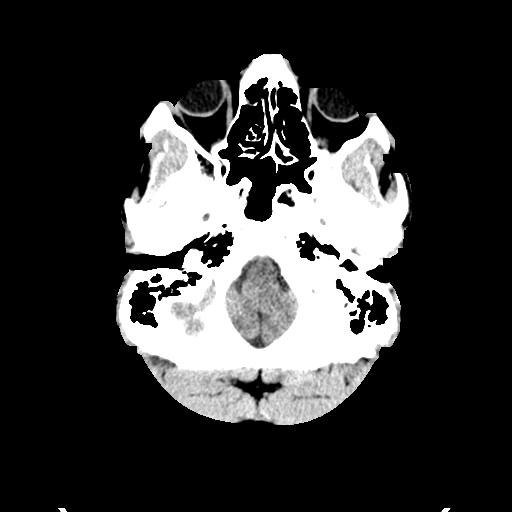
[im 10/33  brain]
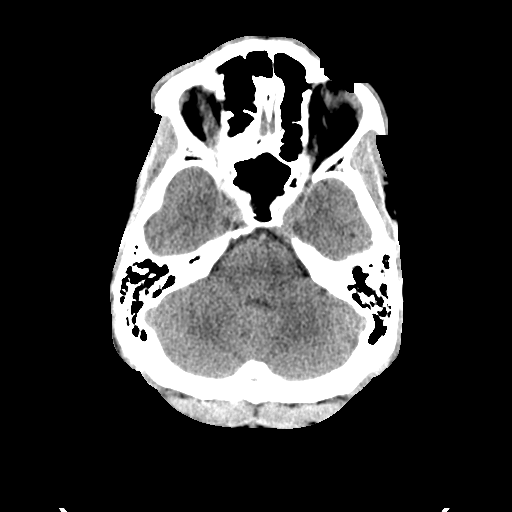
[im 13/33  brain]
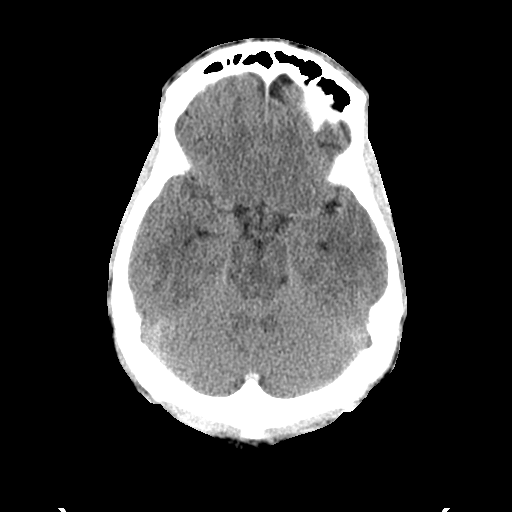
[im 17/33  brain]
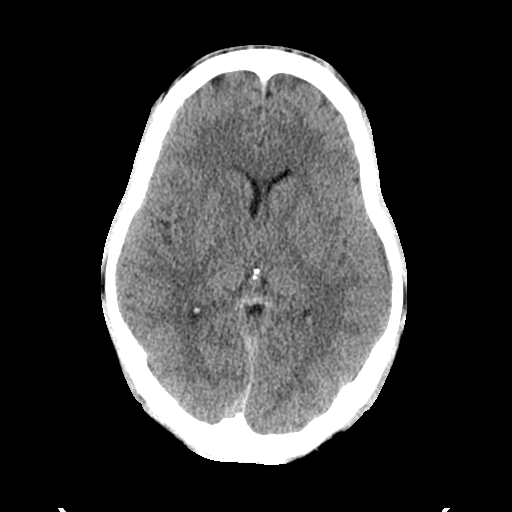
[im 17/33  bone]
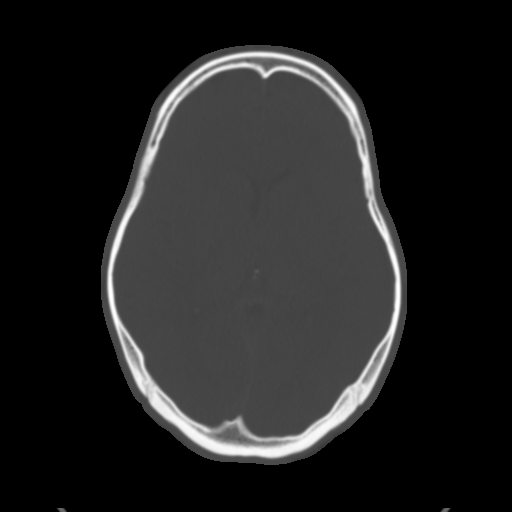
[im 20/33  brain]
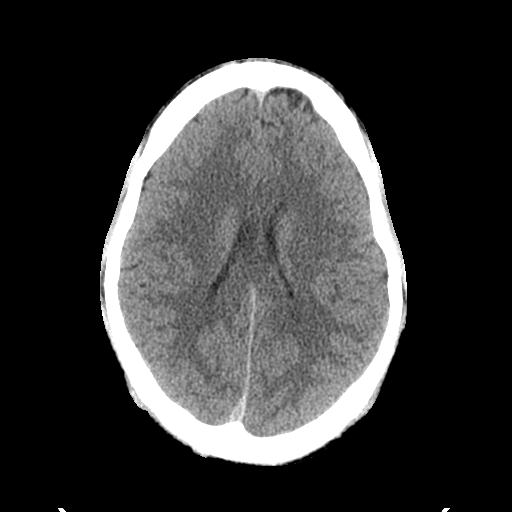
[im 23/33  brain]
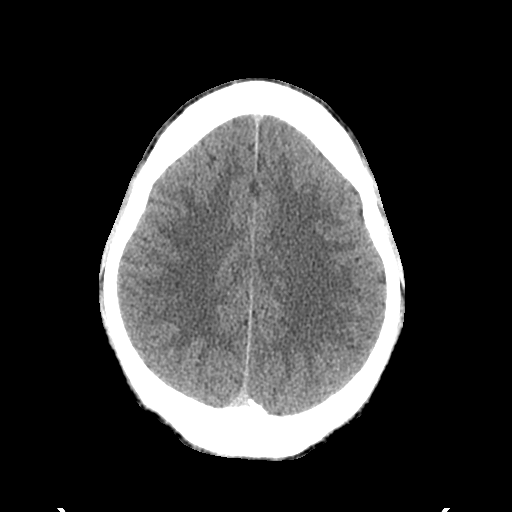
[im 26/33  brain]
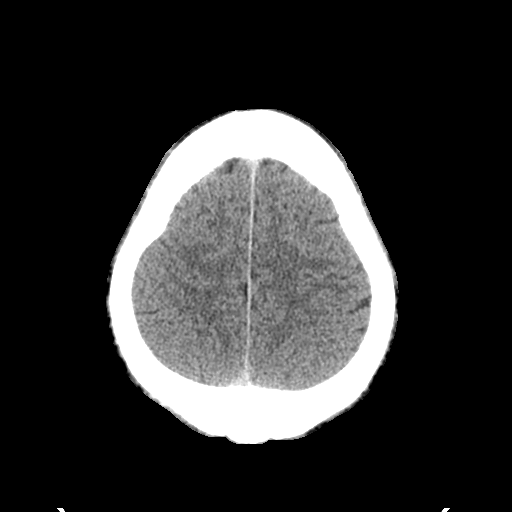
[im 29/33  brain]
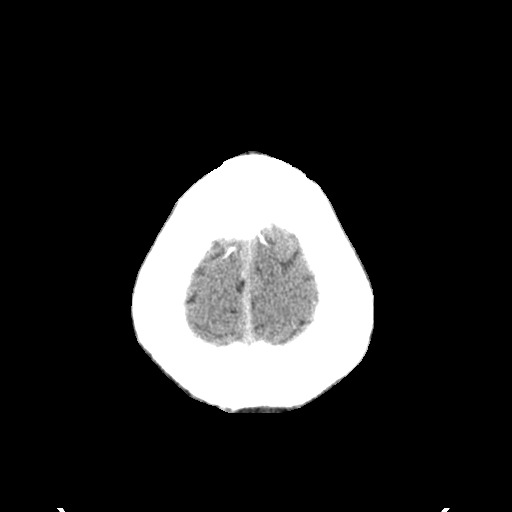
[im 29/33  bone]
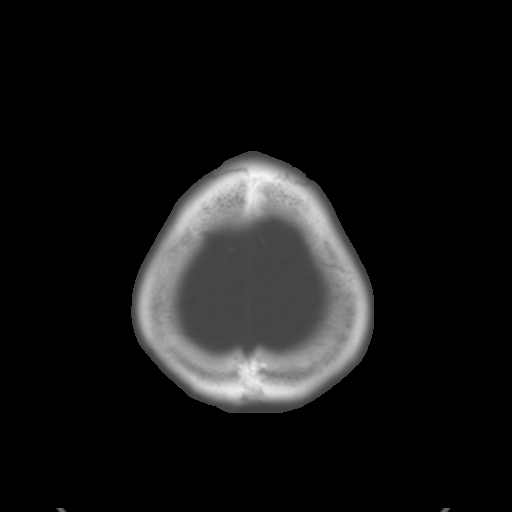

[Series 3: bone windows · axial · 0.48mm/px · z∈[-138,-12]mm · 8 of 55 slices shown]
[im 7/55  bone]
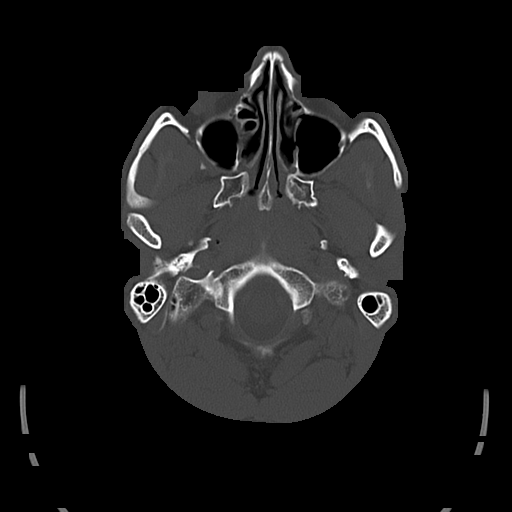
[im 13/55  bone]
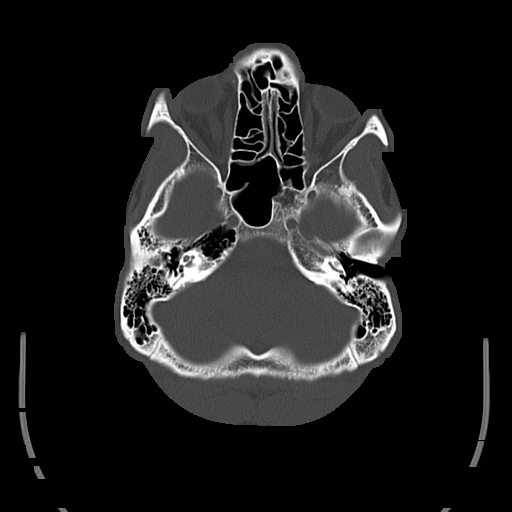
[im 19/55  bone]
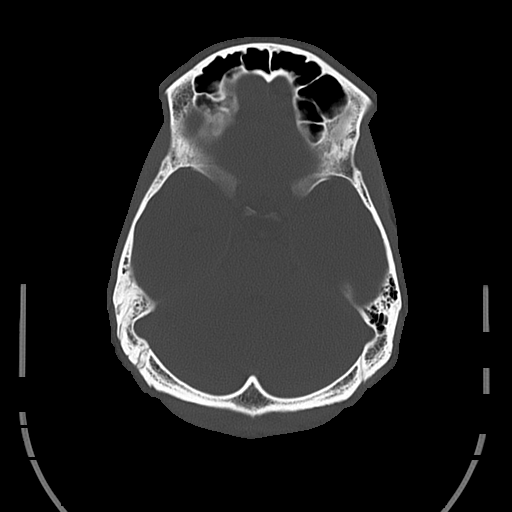
[im 25/55  bone]
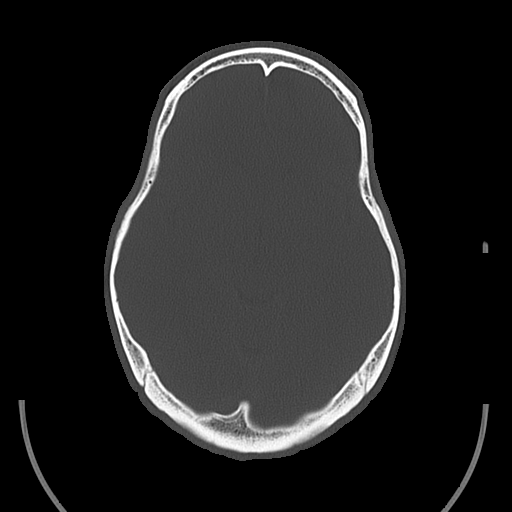
[im 31/55  bone]
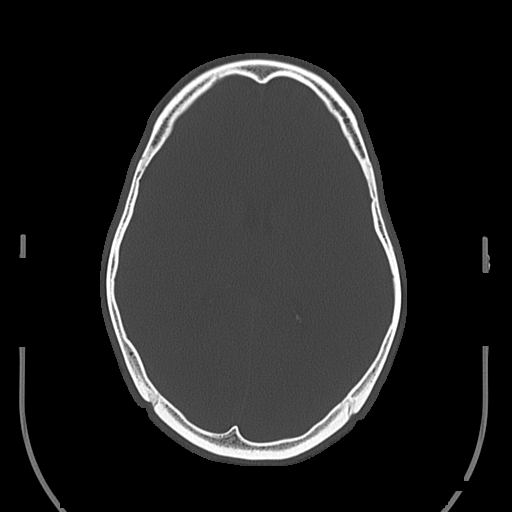
[im 37/55  bone]
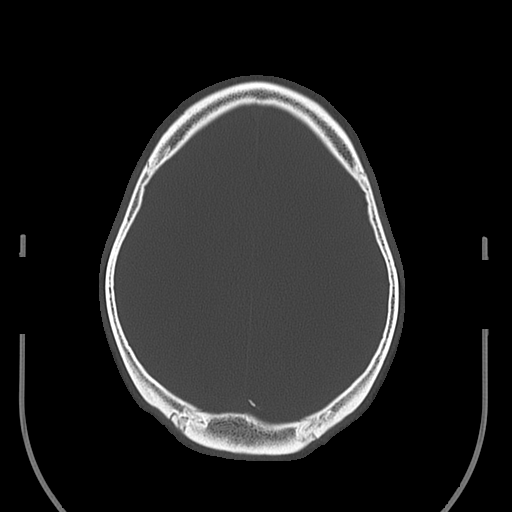
[im 43/55  bone]
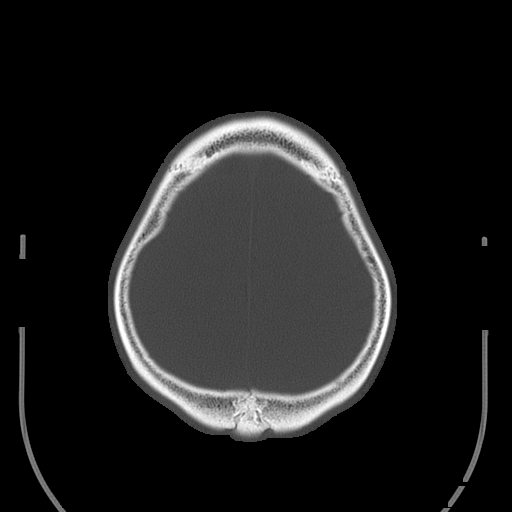
[im 49/55  bone]
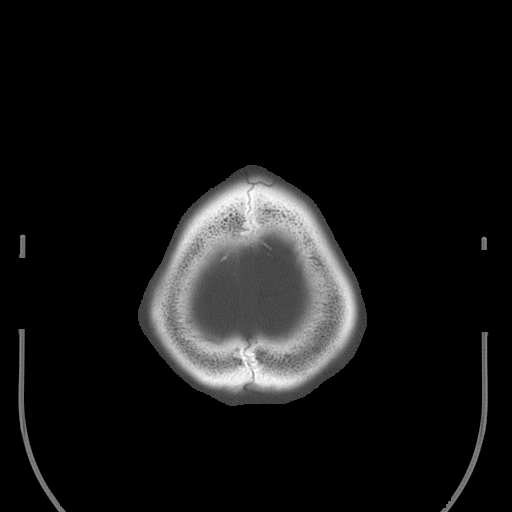

[17 of 30 positions shown; findings below may reference images not displayed]

FINDINGS: No acute intracranial hemorrhage, mass lesion, infarction, midline
shift, hydrocephalus, or extra-axial hemorrhage. Normal gray-white
matter differentiation. Cisterns patent. No cerebellar abnormality.
In the midline along the anterior falx, there is a small left
frontal extra-axial CSF density collection measuring 15 x 10 mm,
image 14. Findings compatible with a small incidental arachnoid
cyst. Mastoids and sinuses clear. No osseous abnormality.
IMPRESSION: No acute intracranial finding.

Incidental midline anterior left frontal arachnoid cyst.

## 2017-04-08 IMAGING — CR DG FINGER MIDDLE 2+V*L*
3 series · 3 of 3 positions shown · non-contrast
Comparison: None.

CLINICAL DATA: Basketball injury with pain in the proximal
interphalangeal joint.

EXAM:
LEFT MIDDLE FINGER 2+V

[finger ap]
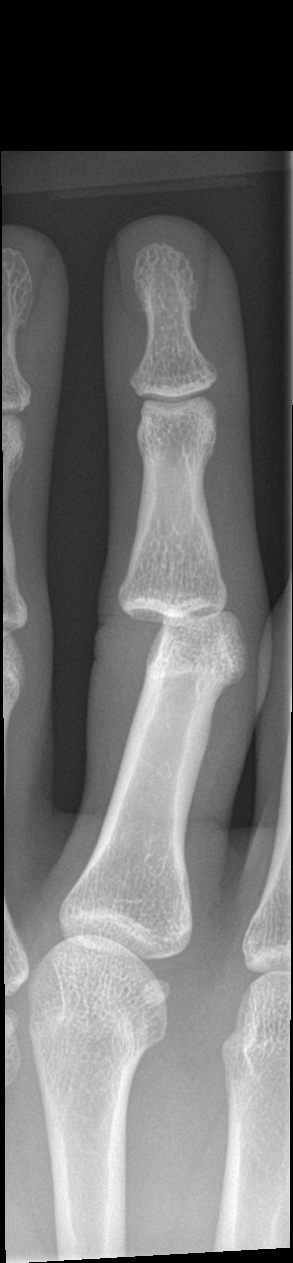

[finger obl]
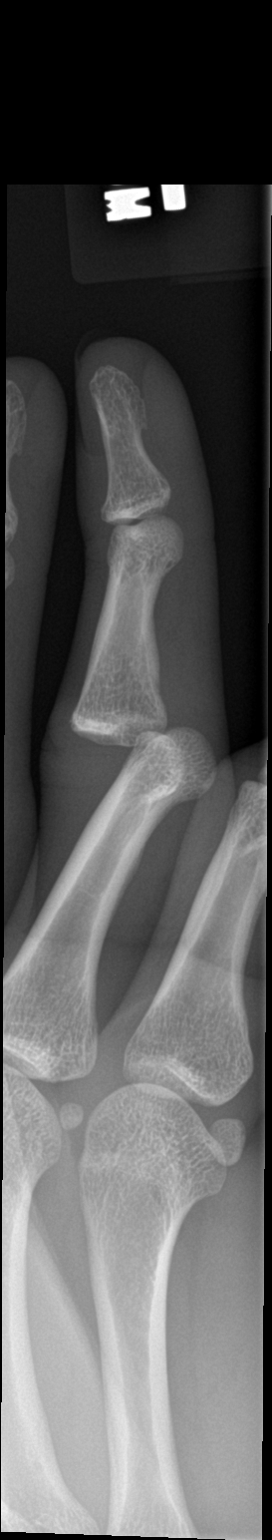

[finger lat]
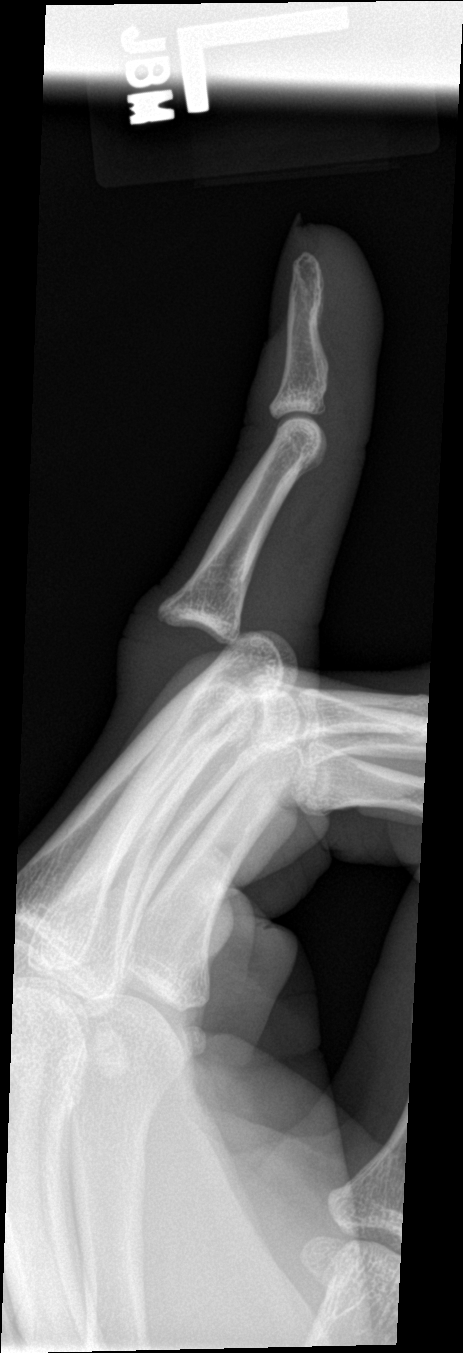

[3 of 3 positions shown; findings below may reference images not displayed]

FINDINGS: The middle phalanx of the middle finger is posteriorly dislocated
with respect to the proximal phalanx, with 2-3 mm of overlap. No
well-defined fracture.
IMPRESSION: 1. Dislocated proximal interphalangeal joint, with the middle
phalanx dislocated posteriorly with respect to the proximal phalanx,
and with 2-3 mm of overlap. No well-defined fracture observed.

## 2017-05-20 ENCOUNTER — Emergency Department (HOSPITAL_COMMUNITY)
Admission: EM | Admit: 2017-05-20 | Discharge: 2017-05-20 | Disposition: A | Discharge status: Home / Self Care | Payer: Self-pay | Attending: Emergency Medicine | Admitting: Emergency Medicine

## 2017-05-20 ENCOUNTER — Encounter (HOSPITAL_COMMUNITY): Payer: Self-pay | Admitting: Emergency Medicine

## 2017-05-20 DIAGNOSIS — R109 Unspecified abdominal pain: Secondary | ICD-10-CM

## 2017-05-20 DIAGNOSIS — R1084 Generalized abdominal pain: Secondary | ICD-10-CM | POA: Insufficient documentation

## 2017-05-20 DIAGNOSIS — R112 Nausea with vomiting, unspecified: Secondary | ICD-10-CM | POA: Insufficient documentation

## 2017-05-20 DIAGNOSIS — R197 Diarrhea, unspecified: Secondary | ICD-10-CM | POA: Insufficient documentation

## 2017-05-20 DIAGNOSIS — F17210 Nicotine dependence, cigarettes, uncomplicated: Secondary | ICD-10-CM | POA: Insufficient documentation

## 2017-05-20 DIAGNOSIS — R1110 Vomiting, unspecified: Secondary | ICD-10-CM

## 2017-05-20 LAB — COMPREHENSIVE METABOLIC PANEL
ALT: 18 U/L (ref 17–63)
ANION GAP: 9 (ref 5–15)
AST: 19 U/L (ref 15–41)
Albumin: 4.6 g/dL (ref 3.5–5.0)
Alkaline Phosphatase: 45 U/L (ref 38–126)
BUN: 11 mg/dL (ref 6–20)
CALCIUM: 9.5 mg/dL (ref 8.9–10.3)
CHLORIDE: 105 mmol/L (ref 101–111)
CO2: 25 mmol/L (ref 22–32)
Creatinine, Ser: 1.07 mg/dL (ref 0.61–1.24)
Glucose, Bld: 113 mg/dL — ABNORMAL HIGH (ref 65–99)
Potassium: 4 mmol/L (ref 3.5–5.1)
SODIUM: 139 mmol/L (ref 135–145)
Total Bilirubin: 1 mg/dL (ref 0.3–1.2)
Total Protein: 7.5 g/dL (ref 6.5–8.1)

## 2017-05-20 LAB — CBC
HCT: 44.5 % (ref 39.0–52.0)
HEMOGLOBIN: 15.5 g/dL (ref 13.0–17.0)
MCH: 31.7 pg (ref 26.0–34.0)
MCHC: 34.8 g/dL (ref 30.0–36.0)
MCV: 91 fL (ref 78.0–100.0)
PLATELETS: 133 10*3/uL — AB (ref 150–400)
RBC: 4.89 MIL/uL (ref 4.22–5.81)
RDW: 12.9 % (ref 11.5–15.5)
WBC: 4.4 10*3/uL (ref 4.0–10.5)

## 2017-05-20 LAB — URINALYSIS, ROUTINE W REFLEX MICROSCOPIC
BILIRUBIN URINE: NEGATIVE
GLUCOSE, UA: NEGATIVE mg/dL
HGB URINE DIPSTICK: NEGATIVE
KETONES UR: NEGATIVE mg/dL
Leukocytes, UA: NEGATIVE
Nitrite: NEGATIVE
PROTEIN: NEGATIVE mg/dL
Specific Gravity, Urine: 1.006 (ref 1.005–1.030)
pH: 7 (ref 5.0–8.0)

## 2017-05-20 LAB — TYPE AND SCREEN
ABO/RH(D): B POS
Antibody Screen: NEGATIVE

## 2017-05-20 LAB — LIPASE, BLOOD: LIPASE: 24 U/L (ref 11–51)

## 2017-05-20 LAB — ABO/RH: ABO/RH(D): B POS

## 2017-05-20 MED ORDER — RANITIDINE HCL 150 MG PO TABS: 150.0000 mg | ORAL_TABLET | Freq: Two times a day (BID) | ORAL | 0 refills | Status: AC

## 2017-05-20 MED ORDER — PANTOPRAZOLE SODIUM 20 MG PO TBEC: 20.0000 mg | DELAYED_RELEASE_TABLET | Freq: Every day | ORAL | 0 refills | Status: AC

## 2017-05-20 MED ORDER — ONDANSETRON 8 MG PO TBDP
8.0000 mg | ORAL_TABLET | Freq: Once | ORAL | Status: AC
  Administered 2017-05-20: 8 mg via ORAL
  Filled 2017-05-20: qty 1

## 2017-05-20 MED ORDER — PANTOPRAZOLE SODIUM 40 MG PO TBEC
40.0000 mg | DELAYED_RELEASE_TABLET | Freq: Every day | ORAL | Status: DC
  Administered 2017-05-20: 40 mg via ORAL
  Filled 2017-05-20: qty 1

## 2017-05-20 MED ORDER — GI COCKTAIL ~~LOC~~
30.0000 mL | Freq: Once | ORAL | Status: AC
  Administered 2017-05-20: 30 mL via ORAL
  Filled 2017-05-20: qty 30

## 2017-05-20 MED ORDER — FAMOTIDINE 20 MG PO TABS
10.0000 mg | ORAL_TABLET | Freq: Once | ORAL | Status: AC
  Administered 2017-05-20: 10 mg via ORAL
  Filled 2017-05-20: qty 1

## 2017-05-20 NOTE — ED Notes (Signed)
Bed: WA05 Expected date:  Expected time:  Means of arrival:  Comments: 

## 2017-05-20 NOTE — ED Triage Notes (Signed)
Per EMS. Pt reports he has been having abd pain, emesis and diarrhea with blood in stool for the past 6 months. Symptoms worse in the morning. Pt also reports he threw up blood this am. Pt NAD walking to triage

## 2017-05-20 NOTE — ED Provider Notes (Signed)
Emergency Department Provider Note   I have reviewed the triage vital signs and the nursing notes.   HISTORY  Chief Complaint Abdominal Pain and Hematemesis   HPI Brent Elliott is a 31 y.o. male without significant past medical history the presents to the emergency department today with 6-8 months of intermittent nausea and vomiting that seems to be worse in the morning.  Sometimes associated with some blood streaked vomit and sometimes having some blood-streaked diarrhea as well.  This morning had an episode that seemed more blood than normal with worsening abdominal pain so came here for evaluation.  States that his diet is been very poor since having 3 children under the age of 69.  He also has decreased sleep, smokes cigarettes and marijuana.  States he has been told he likely had reflux in the past.  Does not notice any correlation with certain foods however does feel like it is worse in the mornings.  No fevers.  No family history of inflammatory bowel disease.  No trauma.  No constipation.  Will have multiple days where he is entirely asymptomatic and then will happen again.  No other associated or modifying symptoms.   History reviewed. No pertinent past medical history.  There are no active problems to display for this patient.   History reviewed. No pertinent surgical history.  Current Outpatient Rx  . Order #: [id number] Class: Print  . Order #: [id number] Class: Print    Allergies Patient has no known allergies.  History reviewed. No pertinent family history.  Social History Social History  Substance Use Topics  . Smoking status: Current Some Day Smoker  . Smokeless tobacco: Not on file  . Alcohol use Yes     Comment: occ    Review of Systems  All other systems negative except as documented in the HPI. All pertinent positives and negatives as reviewed in the HPI. ____________________________________________   PHYSICAL EXAM:  VITAL SIGNS: ED Triage Vitals   Enc Vitals Group     BP 05/20/17 0854 119/76     Pulse Rate 05/20/17 0854 76     Resp 05/20/17 0854 18     Temp 05/20/17 0854 97.6 F (36.4 C)     Temp src --      SpO2 05/20/17 0847 99 %     Weight --      Height --      Head Circumference --      Peak Flow --      Pain Score 05/20/17 0857 3     Pain Loc --      Pain Edu? --      Excl. in GC? --     Constitutional: Alert and oriented. Well appearing and in no acute distress. Eyes: Conjunctivae are normal. PERRL. EOMI. Head: Atraumatic. Nose: No congestion/rhinnorhea. Mouth/Throat: Mucous membranes are moist.  Oropharynx non-erythematous. Neck: No stridor.  No meningeal signs.   Cardiovascular: Normal rate, regular rhythm. Good peripheral circulation. Grossly normal heart sounds.   Respiratory: Normal respiratory effort.  No retractions. Lungs CTAB. Gastrointestinal: Soft and nontender. No distention.  Musculoskeletal: No lower extremity tenderness nor edema. No gross deformities of extremities. Neurologic:  Normal speech and language. No gross focal neurologic deficits are appreciated.  Skin:  Skin is warm, dry and intact. No rash noted. ____________________________________________   LABS (all labs ordered are listed, but only abnormal results are displayed)  Labs Reviewed  COMPREHENSIVE METABOLIC PANEL - Abnormal; Notable for the following:  Result Value   Glucose, Bld 113 (*)    All other components within normal limits  CBC - Abnormal; Notable for the following:    Platelets 133 (*)    All other components within normal limits  LIPASE, BLOOD  URINALYSIS, ROUTINE W REFLEX MICROSCOPIC  POC OCCULT BLOOD, ED  TYPE AND SCREEN  ABO/RH   ____________________________________________   RADIOLOGY  No results found.  ____________________________________________   PROCEDURES  Procedure(s) performed:   Procedures   ____________________________________________   INITIAL IMPRESSION / ASSESSMENT AND  PLAN / ED COURSE  Pertinent labs & imaging results that were available during my care of the patient were reviewed by me and considered in my medical decision making (see chart for details).  Abdomen benign. Labs reassuring.  I think inflammatory bowel disease is unlikely. No indication for imaging at this time.  I think his symptoms are more likely related to gastritis or esophagitis.  We will treat for the same and then p.o. Challenge.  Symptoms improved here. Likely gastritis. Will not get CT. Dc on ppi/h2 blocker.  ____________________________________________  FINAL CLINICAL IMPRESSION(S) / ED DIAGNOSES  Final diagnoses:  Vomiting, intractability of vomiting not specified, presence of nausea not specified, unspecified vomiting type  Abdominal pain, unspecified abdominal location     MEDICATIONS GIVEN DURING THIS VISIT:  Medications  pantoprazole (PROTONIX) EC tablet 40 mg (40 mg Oral Given 05/20/17 1043)  ondansetron (ZOFRAN-ODT) disintegrating tablet 8 mg (8 mg Oral Given 05/20/17 1043)  gi cocktail (Maalox,Lidocaine,Donnatal) (30 mLs Oral Given 05/20/17 1044)  famotidine (PEPCID) tablet 10 mg (10 mg Oral Given 05/20/17 1043)     NEW OUTPATIENT MEDICATIONS STARTED DURING THIS VISIT:  New Prescriptions   PANTOPRAZOLE (PROTONIX) 20 MG TABLET    Take 1 tablet (20 mg total) by mouth daily.   RANITIDINE (ZANTAC) 150 MG TABLET    Take 1 tablet (150 mg total) by mouth 2 (two) times daily.    Note:  This document was prepared using Dragon voice recognition software and may include unintentional dictation errors.   Aerik Polan, Barbara CowerJason, MD 05/20/17 [phone]

## 2018-07-01 ENCOUNTER — Emergency Department (HOSPITAL_BASED_OUTPATIENT_CLINIC_OR_DEPARTMENT_OTHER)
Admission: EM | Admit: 2018-07-01 | Discharge: 2018-07-01 | Disposition: A | Discharge status: Home / Self Care | Payer: Self-pay | Attending: Emergency Medicine | Admitting: Emergency Medicine

## 2018-07-01 ENCOUNTER — Other Ambulatory Visit: Payer: Self-pay

## 2018-07-01 ENCOUNTER — Encounter (HOSPITAL_BASED_OUTPATIENT_CLINIC_OR_DEPARTMENT_OTHER): Payer: Self-pay

## 2018-07-01 DIAGNOSIS — Z5321 Procedure and treatment not carried out due to patient leaving prior to being seen by health care provider: Secondary | ICD-10-CM | POA: Insufficient documentation

## 2018-07-01 DIAGNOSIS — K1370 Unspecified lesions of oral mucosa: Secondary | ICD-10-CM | POA: Insufficient documentation

## 2018-07-01 NOTE — ED Triage Notes (Signed)
C/o mouth sores and multiple toothaches x "years"-NAD-steady gait

## 2018-12-09 NOTE — Progress Notes (Signed)
COVID Hotel Screening performed. Temperature, PHQ-9, and need for medical care and medications assessed. Patient is complaining of increased pain in both jaws. He also states that his back  teeth are breaking easily. He is requesting assistance with dental care. He does not have an MD or insurance. Referral sent to Carole[facility].   Carlyle Basques RN MSN

## 2018-12-16 NOTE — Progress Notes (Signed)
COVID Hotel Screening performed. Temperature, PHQ-9, and need for medical care and medications assessed. No additional needs assessed at this time.  Kaydenn Mclear RN MSN 

## 2018-12-23 ENCOUNTER — Other Ambulatory Visit: Payer: Self-pay | Admitting: Hematology

## 2018-12-23 DIAGNOSIS — Z20822 Contact with and (suspected) exposure to covid-19: Secondary | ICD-10-CM

## 2018-12-23 NOTE — Progress Notes (Signed)
COVID screening orders placed for mobile testing unit

## 2018-12-24 ENCOUNTER — Other Ambulatory Visit: Payer: Self-pay | Admitting: *Deleted

## 2018-12-24 DIAGNOSIS — Z20822 Contact with and (suspected) exposure to covid-19: Secondary | ICD-10-CM

## 2018-12-25 NOTE — Addendum Note (Signed)
Addended by: Sartaj Hoskin M on: 12/25/2018 11:45 AM   Modules accepted: Orders  

## 2018-12-31 NOTE — Progress Notes (Signed)
COVID Hotel Screening performed. COVID screening, temperature, PHQ-9, and need for medical care and medications assessed. No additional needs assessed at this time.  Aniyah Nobis RN MSN 

## 2019-01-06 NOTE — Progress Notes (Signed)
COVID Hotel Screening performed. COVID screening, temperature, PHQ-9, and need for medical care and medications assessed. No additional needs assessed at this time.  Ifeanyichukwu Wickham RN MSN 

## 2019-01-29 ENCOUNTER — Other Ambulatory Visit: Payer: Self-pay | Admitting: Internal Medicine

## 2019-01-29 LAB — NOVEL CORONAVIRUS, NAA: SARS-CoV-2, NAA: NOT DETECTED

## 2019-02-04 LAB — NOVEL CORONAVIRUS, NAA: SARS-CoV-2, NAA: NOT DETECTED

## 2019-02-05 ENCOUNTER — Encounter: Payer: Self-pay | Admitting: Hematology
